# Patient Record
Sex: Male | Born: 1950
Health system: Southern US, Community
[De-identification: ages and names within clinical notes are randomized; demographics above are authoritative.]

## PROBLEM LIST (undated history)

## (undated) DIAGNOSIS — K5792 Diverticulitis of intestine, part unspecified, without perforation or abscess without bleeding: Secondary | ICD-10-CM

---

## 2016-07-05 DIAGNOSIS — R05 Cough: Secondary | ICD-10-CM | POA: Diagnosis not present

## 2016-07-05 DIAGNOSIS — J069 Acute upper respiratory infection, unspecified: Secondary | ICD-10-CM | POA: Diagnosis not present

## 2016-07-05 DIAGNOSIS — Z79899 Other long term (current) drug therapy: Secondary | ICD-10-CM | POA: Diagnosis not present

## 2016-07-05 DIAGNOSIS — J209 Acute bronchitis, unspecified: Secondary | ICD-10-CM | POA: Diagnosis not present

## 2016-07-05 DIAGNOSIS — R0602 Shortness of breath: Secondary | ICD-10-CM | POA: Diagnosis not present

## 2017-02-09 DIAGNOSIS — N401 Enlarged prostate with lower urinary tract symptoms: Secondary | ICD-10-CM | POA: Diagnosis not present

## 2017-02-09 DIAGNOSIS — G4733 Obstructive sleep apnea (adult) (pediatric): Secondary | ICD-10-CM | POA: Diagnosis not present

## 2017-02-09 DIAGNOSIS — I10 Essential (primary) hypertension: Secondary | ICD-10-CM | POA: Diagnosis not present

## 2017-02-09 DIAGNOSIS — E784 Other hyperlipidemia: Secondary | ICD-10-CM | POA: Diagnosis not present

## 2017-03-24 ENCOUNTER — Encounter (HOSPITAL_COMMUNITY): Payer: Self-pay | Admitting: *Deleted

## 2017-03-24 ENCOUNTER — Emergency Department (HOSPITAL_COMMUNITY): Payer: Medicare Other

## 2017-03-24 ENCOUNTER — Emergency Department (HOSPITAL_COMMUNITY)
Admission: EM | Admit: 2017-03-24 | Discharge: 2017-03-24 | Disposition: A | Discharge status: Home / Self Care | Payer: Medicare Other | Attending: Emergency Medicine | Admitting: Emergency Medicine

## 2017-03-24 DIAGNOSIS — R509 Fever, unspecified: Secondary | ICD-10-CM | POA: Diagnosis not present

## 2017-03-24 DIAGNOSIS — Z87891 Personal history of nicotine dependence: Secondary | ICD-10-CM | POA: Diagnosis not present

## 2017-03-24 DIAGNOSIS — J40 Bronchitis, not specified as acute or chronic: Secondary | ICD-10-CM | POA: Diagnosis not present

## 2017-03-24 DIAGNOSIS — R05 Cough: Secondary | ICD-10-CM | POA: Diagnosis not present

## 2017-03-24 MED ORDER — PREDNISONE 20 MG PO TABS: ORAL_TABLET | ORAL | 0 refills | Status: AC

## 2017-03-24 MED ORDER — ALBUTEROL SULFATE HFA 108 (90 BASE) MCG/ACT IN AERS
2.0000 | INHALATION_SPRAY | RESPIRATORY_TRACT | Status: DC | PRN
  Administered 2017-03-24: 2 via RESPIRATORY_TRACT
  Filled 2017-03-24: qty 6.7

## 2017-03-24 MED ORDER — BENZONATATE 100 MG PO CAPS: 100.0000 mg | ORAL_CAPSULE | Freq: Three times a day (TID) | ORAL | 0 refills | Status: AC

## 2017-03-24 MED ORDER — ALBUTEROL SULFATE (2.5 MG/3ML) 0.083% IN NEBU
5.0000 mg | INHALATION_SOLUTION | Freq: Once | RESPIRATORY_TRACT | Status: AC
  Administered 2017-03-24: 5 mg via RESPIRATORY_TRACT
  Filled 2017-03-24: qty 6

## 2017-03-24 NOTE — ED Provider Notes (Signed)
[facility]-EMERGENCY DEPT Provider Note   CSN: [id number] Arrival date & time: 03/24/17  3474     History   Chief Complaint Chief Complaint  Patient presents with  . Cough    HPI Ronald Andrews is a 66 y.o. male.  HPI  66 year old male, former smoker presenting for evaluation of a cough. Patient report for the past 3-4 days he has had subjective fever, chills, sinus congestion, chest congestion, cough productive with yellowish-green sputum, mild throat irritation, occasional sneezing. Symptoms felt similar to prior bronchitis that he had in the past. He recently moved to [place] from up [place]. For the past several days he has been using Mucinex with some improvement. He denies exertional chest pain, but does endorse occasional wheezing. No report of nausea vomiting or diarrhea. Denies history of COPD. He is a former smoker.  History reviewed. No pertinent past medical history.  There are no active problems to display for this patient.   History reviewed. No pertinent surgical history.     Home Medications    Prior to Admission medications   Not on File    Family History No family history on file.  Social History Social History  Substance Use Topics  . Smoking status: Former Games developer  . Smokeless tobacco: Never Used  . Alcohol use No     Allergies   Patient has no allergy information on record.   Review of Systems Review of Systems  All other systems reviewed and are negative.    Physical Exam Updated Vital Signs BP (!) 170/97 (BP Location: Right Arm)   Pulse 66   Temp 98.2 F (36.8 C) (Oral)   Resp 19   Ht 6' (1.829 m)   Wt 106.6 kg (235 lb)   SpO2 99%   BMI 31.87 kg/m   Physical Exam  Constitutional: He is oriented to person, place, and time. He appears well-developed and well-nourished. No distress.  Patient is well-appearing, sitting comfortably in no acute discomfort.  HENT:  Head: Atraumatic.  Right Ear: External ear normal.  Left Ear:  External ear normal.  Mouth/Throat: Oropharynx is clear and moist.  Eyes: Conjunctivae are normal.  Neck: Normal range of motion. Neck supple.  Cardiovascular: Normal rate, regular rhythm and intact distal pulses.   Pulmonary/Chest: Effort normal and breath sounds normal.  Able to speak in complete sentences, in no acute respiratory comfort. On lung exam, scattered rhonchi heard without overt wheezes or rales.  Abdominal: Soft. He exhibits no distension. There is no tenderness.  Musculoskeletal: He exhibits no edema.  Lymphadenopathy:    He has no cervical adenopathy.  Neurological: He is alert and oriented to person, place, and time.  Skin: No rash noted.  Psychiatric: He has a normal mood and affect.  Nursing note and vitals reviewed.    ED Treatments / Results  Labs (all labs ordered are listed, but only abnormal results are displayed) Labs Reviewed - No data to display  EKG  EKG Interpretation None       Radiology Dg Chest 2 View  Result Date: 03/24/2017 CLINICAL DATA:  Cough for 3 days EXAM: CHEST  2 VIEW COMPARISON:  None. FINDINGS: Normal heart size. Under aeration with bibasilar atelectasis. No pneumothorax. No pleural effusion. IMPRESSION: Bibasilar atelectasis. Electronically Signed   By: Jolaine Click M.D.   On: 03/24/2017 10:23    Procedures Procedures (including critical care time)  Medications Ordered in ED Medications  albuterol (PROVENTIL HFA;VENTOLIN HFA) 108 (90 Base) MCG/ACT inhaler 2 puff (not  administered)  albuterol (PROVENTIL) (2.5 MG/3ML) 0.083% nebulizer solution 5 mg (5 mg Nebulization Given 03/24/17 1102)     Initial Impression / Assessment and Plan / ED Course  I have reviewed the triage vital signs and the nursing notes.  Pertinent labs & imaging results that were available during my care of the patient were reviewed by me and considered in my medical decision making (see chart for details).     BP (!) 170/97 (BP Location: Right Arm)    Pulse 66   Temp 98.2 F (36.8 C) (Oral)   Resp 19   Ht 6' (1.829 m)   Wt 106.6 kg (235 lb)   SpO2 99%   BMI 31.87 kg/m  The patient was noted to be hypertensive today in the emergency department. I have spoken with the patient regarding hypertension and the need for improved management. I instructed the patient to followup with the Primary care doctor within 4 days to improve the management of the patient's hypertension. I also counseled the patient regarding the signs and symptoms which would require an emergent visit to an emergency department for hypertensive urgency and/or hypertensive emergency. The patient understood the need for improved hypertensive management.   Final Clinical Impressions(s) / ED Diagnoses   Final diagnoses:  Bronchitis    New Prescriptions New Prescriptions   BENZONATATE (TESSALON) 100 MG CAPSULE    Take 1 capsule (100 mg total) by mouth every 8 (eight) hours.   PREDNISONE (DELTASONE) 20 MG TABLET    2 tabs po daily x 4 days   Pt symptoms consistent with URI, likely bronchitis. CXR negative for acute infiltrate. Pt will be discharged with symptomatic treatment.  Discussed return precautions.  Pt is hemodynamically stable & in NAD prior to discharge.    Fayrene Helper, PA-C 03/24/17 1122    Loren Racer, MD 03/24/17 1356

## 2017-03-24 NOTE — ED Triage Notes (Addendum)
Pt states he has had cough for the past 3 days. Pt states he feels like he has bronchitis. Pt states he has had some shortness of breath, denies chest pain.

## 2017-12-30 IMAGING — CR DG CHEST 2V
2 series · 2 of 2 positions shown · non-contrast
Comparison: None.

CLINICAL DATA: Cough for 3 days

EXAM:
CHEST  2 VIEW

[w chest pa]
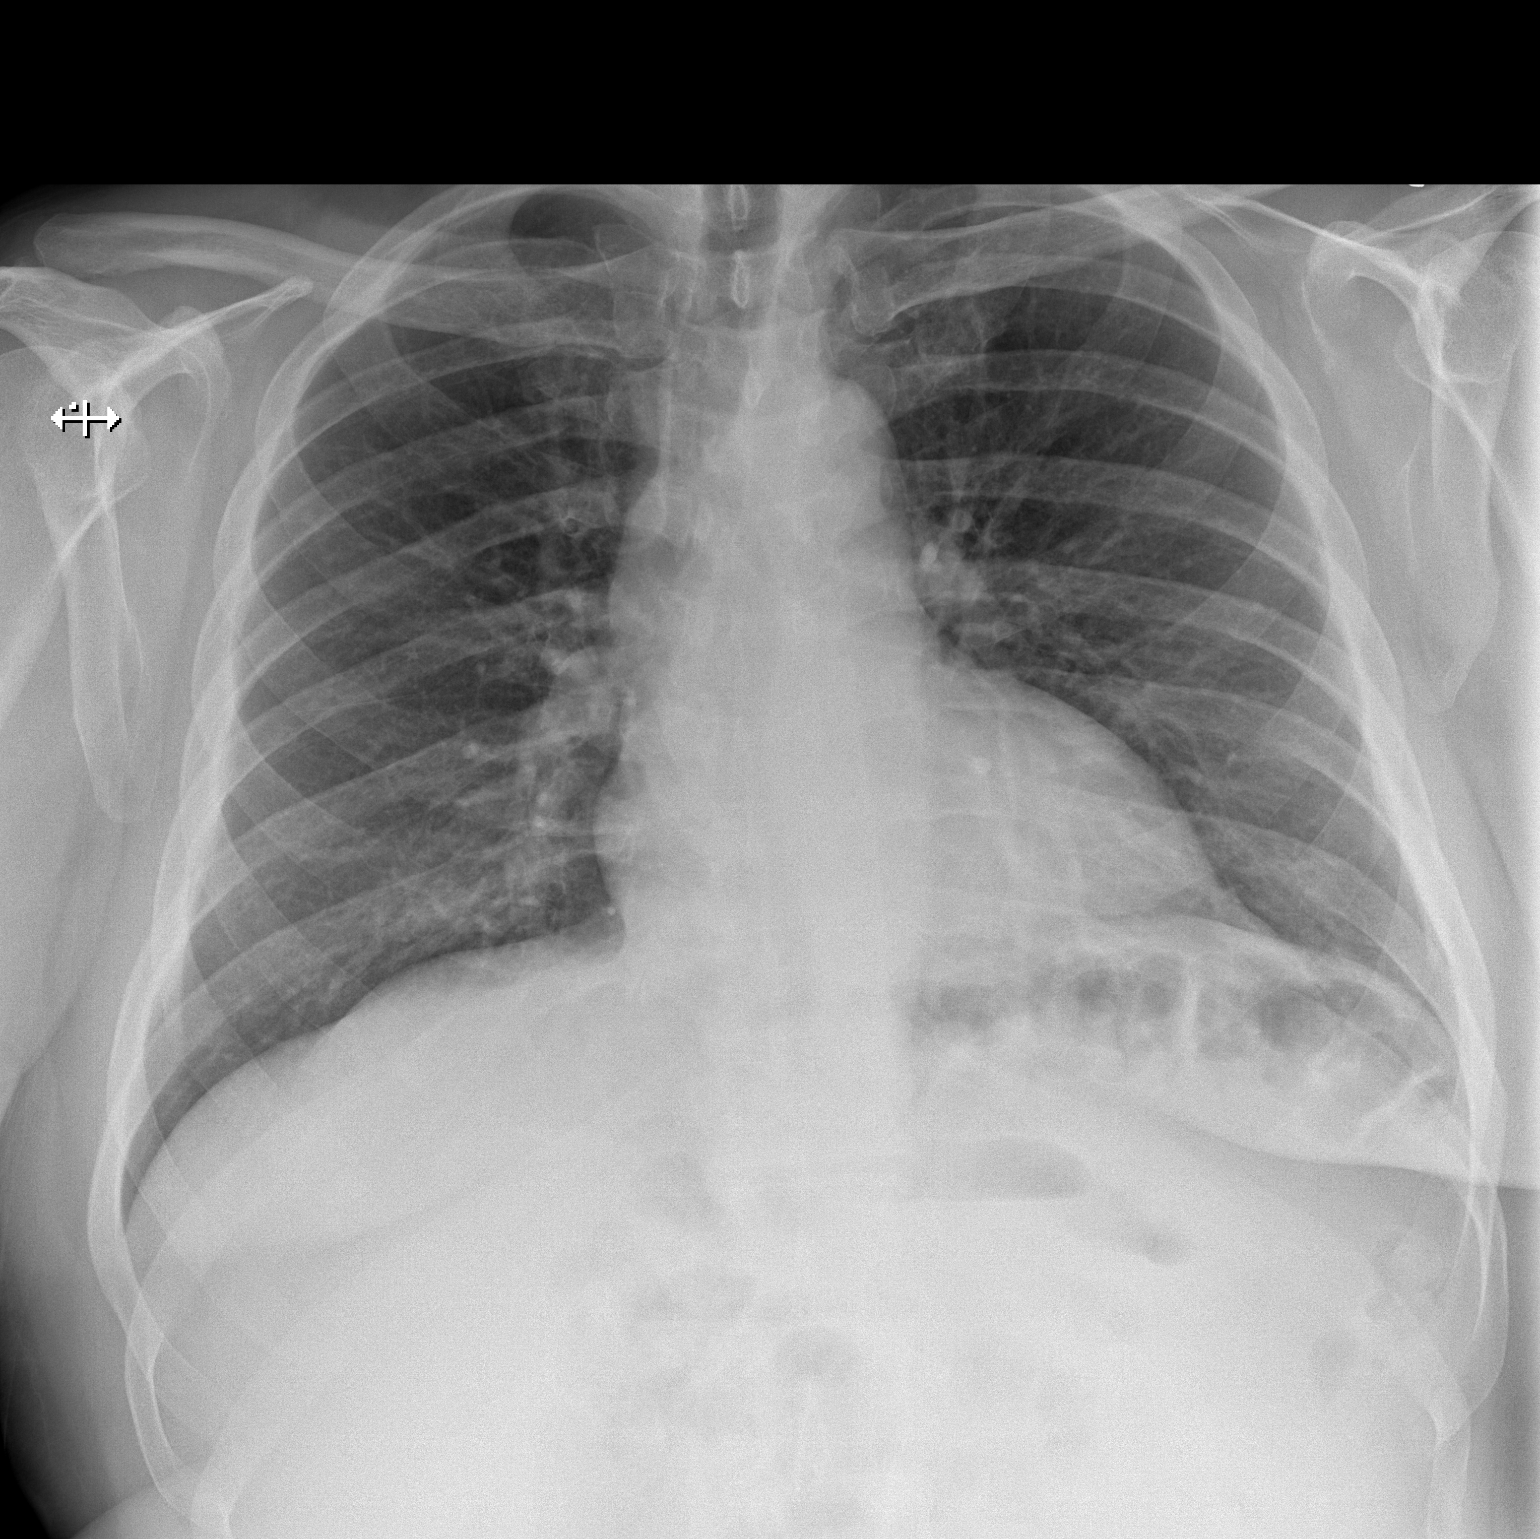

[w chest lat]
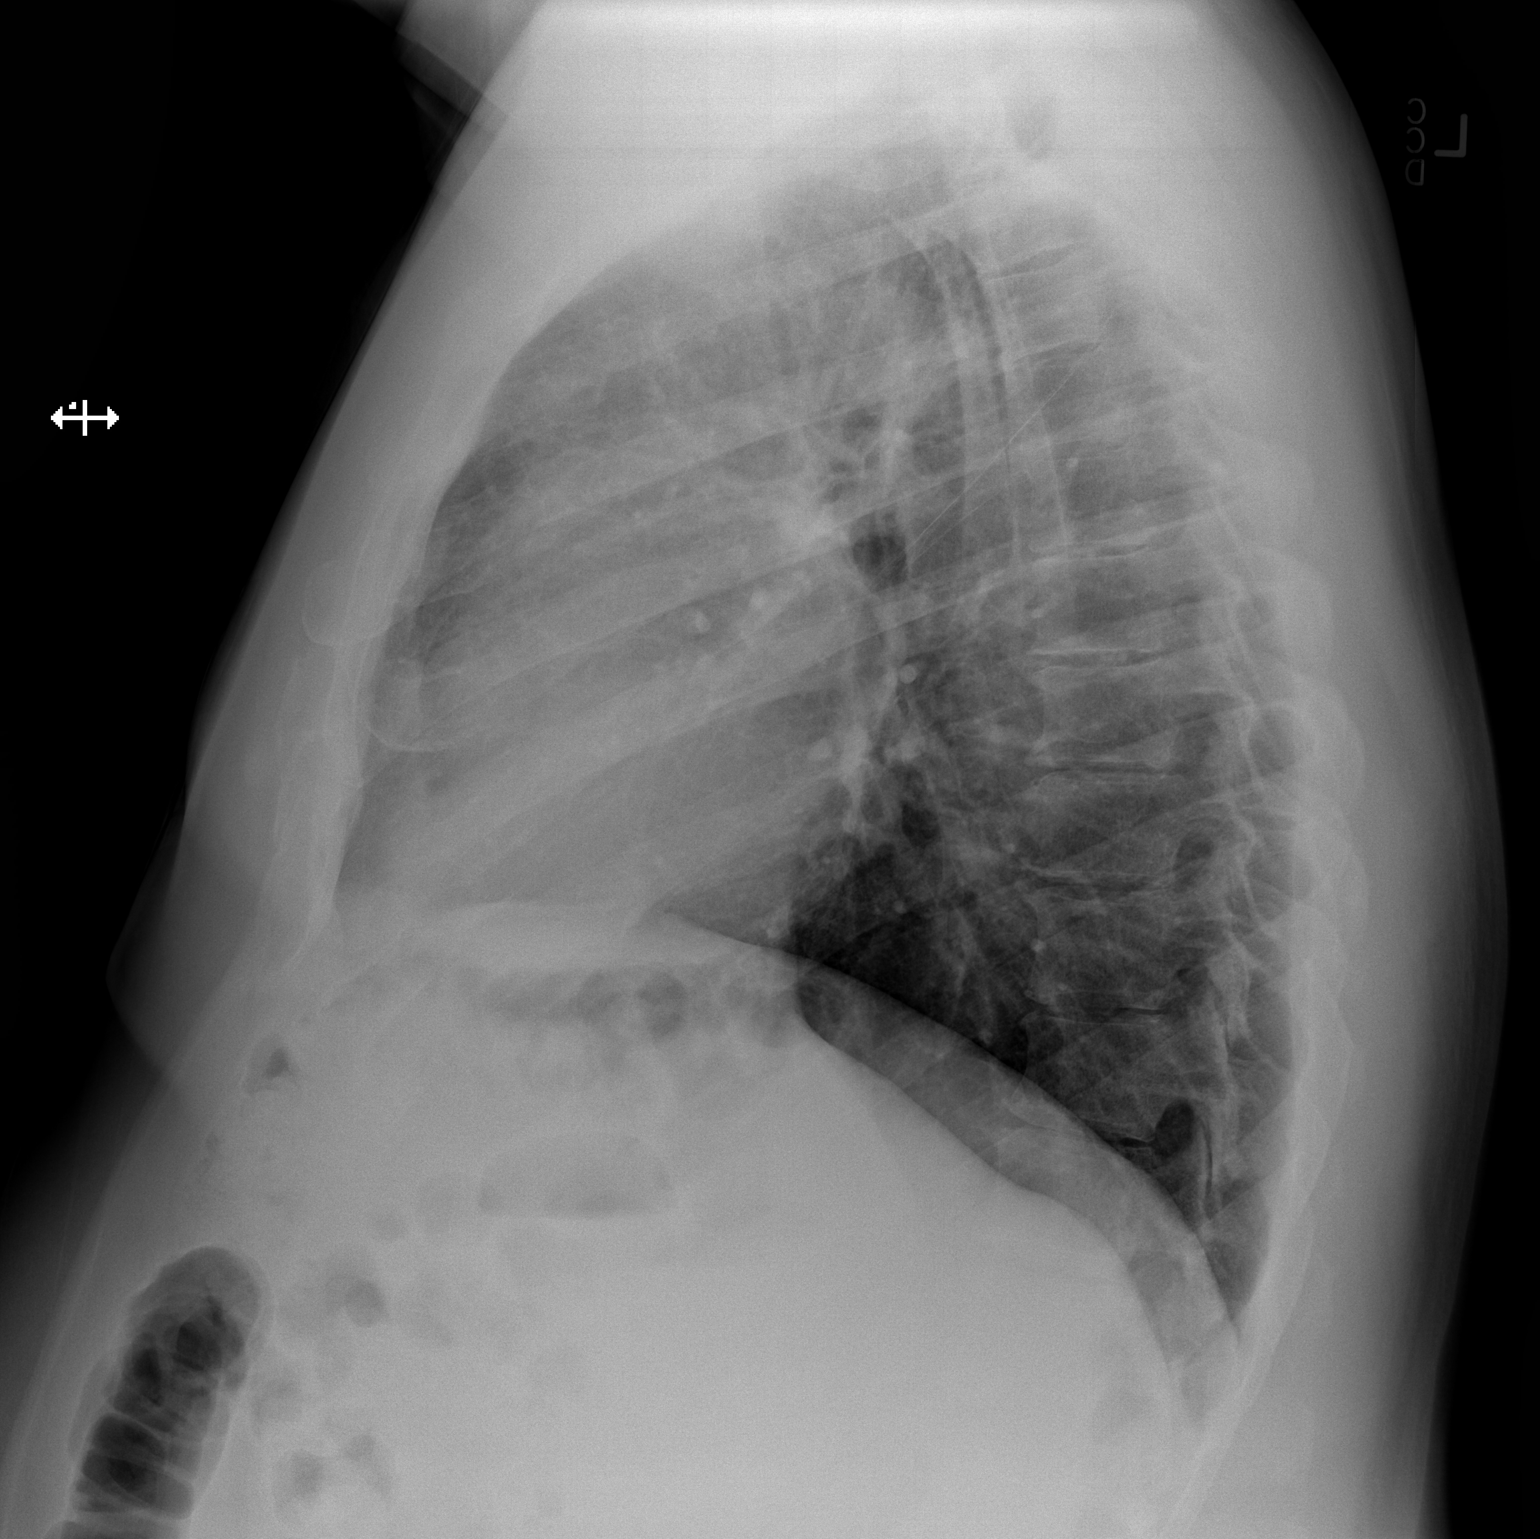

[2 of 2 positions shown; findings below may reference images not displayed]

FINDINGS: Normal heart size. Under aeration with bibasilar atelectasis. No
pneumothorax. No pleural effusion.
IMPRESSION: Bibasilar atelectasis.

## 2018-01-08 DIAGNOSIS — E663 Overweight: Secondary | ICD-10-CM | POA: Diagnosis not present

## 2018-01-08 DIAGNOSIS — Z6834 Body mass index (BMI) 34.0-34.9, adult: Secondary | ICD-10-CM | POA: Diagnosis not present

## 2018-01-08 DIAGNOSIS — E7800 Pure hypercholesterolemia, unspecified: Secondary | ICD-10-CM | POA: Diagnosis not present

## 2018-01-08 DIAGNOSIS — R972 Elevated prostate specific antigen [PSA]: Secondary | ICD-10-CM | POA: Diagnosis not present

## 2018-02-10 DIAGNOSIS — M79671 Pain in right foot: Secondary | ICD-10-CM | POA: Diagnosis not present

## 2018-03-26 ENCOUNTER — Emergency Department (HOSPITAL_COMMUNITY)
Admission: EM | Admit: 2018-03-26 | Discharge: 2018-03-26 | Disposition: A | Discharge status: Home / Self Care | Payer: Medicare Other | Attending: Emergency Medicine | Admitting: Emergency Medicine

## 2018-03-26 ENCOUNTER — Encounter (HOSPITAL_COMMUNITY): Payer: Self-pay | Admitting: *Deleted

## 2018-03-26 DIAGNOSIS — K625 Hemorrhage of anus and rectum: Secondary | ICD-10-CM

## 2018-03-26 DIAGNOSIS — Z87891 Personal history of nicotine dependence: Secondary | ICD-10-CM | POA: Diagnosis not present

## 2018-03-26 HISTORY — DX: Diverticulitis of intestine, part unspecified, without perforation or abscess without bleeding: K57.92

## 2018-03-26 LAB — COMPREHENSIVE METABOLIC PANEL
ALBUMIN: 3.8 g/dL (ref 3.5–5.0)
ALK PHOS: 61 U/L (ref 38–126)
ALT: 23 U/L (ref 0–44)
AST: 37 U/L (ref 15–41)
Anion gap: 8 (ref 5–15)
BILIRUBIN TOTAL: 1.6 mg/dL — AB (ref 0.3–1.2)
BUN: 20 mg/dL (ref 8–23)
CALCIUM: 9.3 mg/dL (ref 8.9–10.3)
CO2: 26 mmol/L (ref 22–32)
Chloride: 102 mmol/L (ref 98–111)
Creatinine, Ser: 1.01 mg/dL (ref 0.61–1.24)
GFR calc Af Amer: >60 mL/min (ref >60)
GFR calc non Af Amer: >60 mL/min (ref >60)
GLUCOSE: 97 mg/dL (ref 70–99)
Potassium: 4.4 mmol/L (ref 3.5–5.1)
Sodium: 136 mmol/L (ref 135–145)
TOTAL PROTEIN: 7.6 g/dL (ref 6.5–8.1)

## 2018-03-26 LAB — CBC
HCT: 50 % (ref 39.0–52.0)
Hemoglobin: 16.2 g/dL (ref 13.0–17.0)
MCH: 30.6 pg (ref 26.0–34.0)
MCHC: 32.4 g/dL (ref 30.0–36.0)
MCV: 94.5 fL (ref 78.0–100.0)
Platelets: 233 10*3/uL (ref 150–400)
RBC: 5.29 MIL/uL (ref 4.22–5.81)
RDW: 13.2 % (ref 11.5–15.5)
WBC: 9.6 10*3/uL (ref 4.0–10.5)

## 2018-03-26 LAB — TYPE AND SCREEN
ABO/RH(D): O POS
ANTIBODY SCREEN: NEGATIVE

## 2018-03-26 LAB — ABO/RH: ABO/RH(D): O POS

## 2018-03-26 NOTE — ED Triage Notes (Addendum)
Pt in c/o rectal bleeding after a bowel movement ongoing for several years and reports an increase in the amt of blood over the last week, c/o increased body weakness, denies abd pain, denies n/v/d, pt reports blood in stool x 1 in 24 hrs, A&O x4

## 2018-03-26 NOTE — ED Notes (Signed)
Pt states that he is going to move his car from the urgent care lot to ED  Lot. Encouraged pt to use valet, states that he prefers to park himself and keep his keys with him. Pt aware that he has a room in [facility]. Patient satisfaction team member to communicate this to fast track pod staff.

## 2018-03-26 NOTE — Discharge Instructions (Signed)
Return for increased bleeding, lightheadedness, feeling like you may pass out.  Follow up with a GI doc to discuss the possibility of colonoscopy.

## 2018-03-26 NOTE — ED Provider Notes (Signed)
[name] EMERGENCY DEPARTMENT Provider Note   CSN: [id number] Arrival date & time: 03/26/18  1044     History   Chief Complaint Chief Complaint  Patient presents with  . Rectal Bleeding    HPI Ronald Andrews is a 67 y.o. male.  67 yo M with a chief complaint of red stool.  This is been going on for many years.  The patient had a colonoscopy/endoscopy done about 20 to 25 years ago in [place].  The procedure was complicated by inadequate sedation and he woke up during it.  Since then he has not wanted to have a repeat.  He states since then off and on he has had red stools.  Describes that there is blood appearing.  He has had 2 in the past week which is more often than he normally has them.  He talked to his sister who suggested he come here to be evaluated.  Has not seen a gastroenterologist since he moved to [place].  He denies feeling lightheaded or dizzy.  Denies abdominal pain or fever.  The history is provided by the patient.  Rectal Bleeding  Quality:  Bright red Amount:  Moderate Duration:  1 week Timing:  Constant Chronicity:  Recurrent Context: rectal pain   Context: not hemorrhoids   Pain details:    Quality:  Hot and sharp Similar prior episodes: yes   Relieved by:  Nothing Worsened by:  Nothing Ineffective treatments:  None tried Associated symptoms: no abdominal pain, no fever and no vomiting     Past Medical History:  Diagnosis Date  . Diverticulitis     There are no active problems to display for this patient.   No past surgical history on file.      Home Medications    Prior to Admission medications   Medication Sig Start Date End Date Taking? Authorizing Provider  benzonatate (TESSALON) 100 MG capsule Take 1 capsule (100 mg total) by mouth every 8 (eight) hours. 03/24/17   Fayrene Helper, PA-C  predniSONE (DELTASONE) 20 MG tablet 2 tabs po daily x 4 days 03/24/17   Fayrene Helper, PA-C    Family History No family  history on file.  Social History Social History   Tobacco Use  . Smoking status: Former Games developer  . Smokeless tobacco: Never Used  Substance Use Topics  . Alcohol use: No  . Drug use: Never     Allergies   Patient has no known allergies.   Review of Systems Review of Systems  Constitutional: Negative for chills and fever.  HENT: Negative for congestion and facial swelling.   Eyes: Negative for discharge and visual disturbance.  Respiratory: Negative for shortness of breath.   Cardiovascular: Negative for chest pain and palpitations.  Gastrointestinal: Positive for blood in stool and hematochezia. Negative for abdominal pain, constipation, diarrhea and vomiting.  Musculoskeletal: Negative for arthralgias and myalgias.  Skin: Negative for color change and rash.  Neurological: Negative for tremors, syncope and headaches.  Psychiatric/Behavioral: Negative for confusion and dysphoric mood.     Physical Exam Updated Vital Signs BP (!) 169/99 (BP Location: Right Arm)   Pulse 71   Temp 97.6 F (36.4 C) (Oral)   Resp 14   SpO2 96%   Physical Exam  Constitutional: He is oriented to person, place, and time. He appears well-developed and well-nourished.  HENT:  Head: Normocephalic and atraumatic.  Eyes: Pupils are equal, round, and reactive to light. EOM are normal.  Neck:  Normal range of motion. Neck supple. No JVD present.  Cardiovascular: Normal rate and regular rhythm. Exam reveals no gallop and no friction rub.  No murmur heard. Pulmonary/Chest: No respiratory distress. He has no wheezes.  Abdominal: He exhibits no distension and no mass. There is no tenderness. There is no rebound and no guarding.  Genitourinary:  Genitourinary Comments: No noted hemorrhoids, no gross blood, stool is soft and brown.  The patient does have some reddish appearing stool.  Does not typically look as I would expect blood to appear.  Musculoskeletal: Normal range of motion.  Neurological: He  is alert and oriented to person, place, and time.  Skin: No rash noted. No pallor.  Psychiatric: He has a normal mood and affect. His behavior is normal.  Nursing note and vitals reviewed.    ED Treatments / Results  Labs (all labs ordered are listed, but only abnormal results are displayed) Labs Reviewed  COMPREHENSIVE METABOLIC PANEL - Abnormal; Notable for the following components:      Result Value   Total Bilirubin 1.6 (*)    All other components within normal limits  CBC  POC OCCULT BLOOD, ED  TYPE AND SCREEN  ABO/RH    EKG None  Radiology No results found.  Procedures Procedures (including critical care time)  Medications Ordered in ED Medications - No data to display   Initial Impression / Assessment and Plan / ED Course  I have reviewed the triage vital signs and the nursing notes.  Pertinent labs & imaging results that were available during my care of the patient were reviewed by me and considered in my medical decision making (see chart for details).     67 yo M with a chief complaint of bright red blood per rectum.  My exam not consistent with blood, his hemoglobin is stable.  Patient does not had a colonoscopy in quite some time.  He is mostly concerned about the thought he may have colon cancer.  Will refer him to gastroenterology.  1:19 PM:  I have discussed the diagnosis/risks/treatment options with the patient and believe the pt to be eligible for discharge home to follow-up with GI. We also discussed returning to the ED immediately if new or worsening sx occur. We discussed the sx which are most concerning (e.g., sudden worsening pain, fever, inability to tolerate by mouth) that necessitate immediate return. Medications administered to the patient during their visit and any new prescriptions provided to the patient are listed below.  Medications given during this visit Medications - No data to display    The patient appears reasonably screen and/or  stabilized for discharge and I doubt any other medical condition or other [facility] requiring further screening, evaluation, or treatment in the ED at this time prior to discharge.    Final Clinical Impressions(s) / ED Diagnoses   Final diagnoses:  Rectal bleeding    ED Discharge Orders    None       Melene Plan, DO 03/26/18 1320

## 2018-04-09 DIAGNOSIS — Z23 Encounter for immunization: Secondary | ICD-10-CM | POA: Diagnosis not present

## 2018-07-31 DIAGNOSIS — Z0000 Encounter for general adult medical examination without abnormal findings: Secondary | ICD-10-CM | POA: Diagnosis not present

## 2018-07-31 DIAGNOSIS — E7800 Pure hypercholesterolemia, unspecified: Secondary | ICD-10-CM | POA: Diagnosis not present

## 2018-07-31 DIAGNOSIS — Z1389 Encounter for screening for other disorder: Secondary | ICD-10-CM | POA: Diagnosis not present

## 2018-07-31 DIAGNOSIS — Z1211 Encounter for screening for malignant neoplasm of colon: Secondary | ICD-10-CM | POA: Diagnosis not present

## 2018-09-26 DIAGNOSIS — K5900 Constipation, unspecified: Secondary | ICD-10-CM | POA: Diagnosis not present

## 2018-09-26 DIAGNOSIS — F419 Anxiety disorder, unspecified: Secondary | ICD-10-CM | POA: Diagnosis not present

## 2018-09-26 DIAGNOSIS — R110 Nausea: Secondary | ICD-10-CM | POA: Diagnosis not present

## 2018-09-26 DIAGNOSIS — G4700 Insomnia, unspecified: Secondary | ICD-10-CM | POA: Diagnosis not present

## 2018-11-03 DIAGNOSIS — R0981 Nasal congestion: Secondary | ICD-10-CM | POA: Diagnosis not present

## 2019-01-07 ENCOUNTER — Other Ambulatory Visit: Payer: Self-pay | Admitting: *Deleted

## 2019-01-07 DIAGNOSIS — Z20822 Contact with and (suspected) exposure to covid-19: Secondary | ICD-10-CM

## 2019-01-07 NOTE — Addendum Note (Signed)
Addended by: Karianne Nogueira M on: 01/07/2019 08:53 PM   Modules accepted: Orders  

## 2019-01-11 LAB — NOVEL CORONAVIRUS, NAA: SARS-CoV-2, NAA: NOT DETECTED

## 2019-01-15 ENCOUNTER — Telehealth: Payer: Self-pay | Admitting: Internal Medicine

## 2019-01-15 NOTE — Telephone Encounter (Signed)
Pt received negative covid results.   °
